# Patient Record
Sex: Male | Born: 1950 | Race: Black or African American | Hispanic: No | Marital: Married | Smoking: Never smoker
Health system: Southern US, Community
[De-identification: ages and names within clinical notes are randomized; demographics above are authoritative.]

## PROBLEM LIST (undated history)

## (undated) DIAGNOSIS — E119 Type 2 diabetes mellitus without complications: Secondary | ICD-10-CM

## (undated) DIAGNOSIS — I2510 Atherosclerotic heart disease of native coronary artery without angina pectoris: Secondary | ICD-10-CM

## (undated) HISTORY — PX: OTHER SURGICAL HISTORY: SHX169

---

## 2020-11-24 ENCOUNTER — Inpatient Hospital Stay (HOSPITAL_COMMUNITY)
Admission: EM | Admit: 2020-11-24 | Discharge: 2020-12-01 | DRG: 155 | Disposition: A | Discharge status: Home / Self Care | Payer: No Typology Code available for payment source | Attending: Surgery | Admitting: Surgery

## 2020-11-24 ENCOUNTER — Emergency Department (HOSPITAL_COMMUNITY): Payer: No Typology Code available for payment source

## 2020-11-24 ENCOUNTER — Other Ambulatory Visit: Payer: Self-pay

## 2020-11-24 ENCOUNTER — Encounter (HOSPITAL_COMMUNITY): Payer: Self-pay | Admitting: *Deleted

## 2020-11-24 DIAGNOSIS — T1490XA Injury, unspecified, initial encounter: Secondary | ICD-10-CM

## 2020-11-24 DIAGNOSIS — E611 Iron deficiency: Secondary | ICD-10-CM | POA: Diagnosis present

## 2020-11-24 DIAGNOSIS — M109 Gout, unspecified: Secondary | ICD-10-CM | POA: Diagnosis present

## 2020-11-24 DIAGNOSIS — Z20822 Contact with and (suspected) exposure to covid-19: Secondary | ICD-10-CM | POA: Diagnosis present

## 2020-11-24 DIAGNOSIS — Z992 Dependence on renal dialysis: Secondary | ICD-10-CM

## 2020-11-24 DIAGNOSIS — S022XXA Fracture of nasal bones, initial encounter for closed fracture: Secondary | ICD-10-CM | POA: Diagnosis not present

## 2020-11-24 DIAGNOSIS — I2510 Atherosclerotic heart disease of native coronary artery without angina pectoris: Secondary | ICD-10-CM | POA: Diagnosis present

## 2020-11-24 DIAGNOSIS — F4310 Post-traumatic stress disorder, unspecified: Secondary | ICD-10-CM | POA: Diagnosis present

## 2020-11-24 DIAGNOSIS — Z955 Presence of coronary angioplasty implant and graft: Secondary | ICD-10-CM

## 2020-11-24 DIAGNOSIS — S3991XA Unspecified injury of abdomen, initial encounter: Secondary | ICD-10-CM | POA: Diagnosis present

## 2020-11-24 DIAGNOSIS — M6282 Rhabdomyolysis: Secondary | ICD-10-CM | POA: Diagnosis present

## 2020-11-24 DIAGNOSIS — N400 Enlarged prostate without lower urinary tract symptoms: Secondary | ICD-10-CM | POA: Diagnosis present

## 2020-11-24 DIAGNOSIS — S270XXA Traumatic pneumothorax, initial encounter: Secondary | ICD-10-CM | POA: Diagnosis present

## 2020-11-24 DIAGNOSIS — E785 Hyperlipidemia, unspecified: Secondary | ICD-10-CM | POA: Diagnosis present

## 2020-11-24 DIAGNOSIS — Z888 Allergy status to other drugs, medicaments and biological substances status: Secondary | ICD-10-CM

## 2020-11-24 DIAGNOSIS — Z794 Long term (current) use of insulin: Secondary | ICD-10-CM

## 2020-11-24 DIAGNOSIS — S2241XA Multiple fractures of ribs, right side, initial encounter for closed fracture: Secondary | ICD-10-CM | POA: Diagnosis present

## 2020-11-24 DIAGNOSIS — I10 Essential (primary) hypertension: Secondary | ICD-10-CM | POA: Diagnosis present

## 2020-11-24 DIAGNOSIS — E1122 Type 2 diabetes mellitus with diabetic chronic kidney disease: Secondary | ICD-10-CM | POA: Diagnosis present

## 2020-11-24 DIAGNOSIS — Z885 Allergy status to narcotic agent status: Secondary | ICD-10-CM

## 2020-11-24 DIAGNOSIS — Y92410 Unspecified street and highway as the place of occurrence of the external cause: Secondary | ICD-10-CM

## 2020-11-24 DIAGNOSIS — N184 Chronic kidney disease, stage 4 (severe): Secondary | ICD-10-CM | POA: Diagnosis present

## 2020-11-24 DIAGNOSIS — D631 Anemia in chronic kidney disease: Secondary | ICD-10-CM | POA: Diagnosis present

## 2020-11-24 DIAGNOSIS — IMO0001 Reserved for inherently not codable concepts without codable children: Secondary | ICD-10-CM | POA: Diagnosis present

## 2020-11-24 DIAGNOSIS — N189 Chronic kidney disease, unspecified: Secondary | ICD-10-CM | POA: Diagnosis present

## 2020-11-24 DIAGNOSIS — N179 Acute kidney failure, unspecified: Secondary | ICD-10-CM | POA: Diagnosis present

## 2020-11-24 DIAGNOSIS — Z8546 Personal history of malignant neoplasm of prostate: Secondary | ICD-10-CM

## 2020-11-24 DIAGNOSIS — K7689 Other specified diseases of liver: Secondary | ICD-10-CM | POA: Diagnosis present

## 2020-11-24 DIAGNOSIS — E875 Hyperkalemia: Secondary | ICD-10-CM | POA: Diagnosis present

## 2020-11-24 DIAGNOSIS — Z7982 Long term (current) use of aspirin: Secondary | ICD-10-CM

## 2020-11-24 DIAGNOSIS — Z79899 Other long term (current) drug therapy: Secondary | ICD-10-CM

## 2020-11-24 DIAGNOSIS — R21 Rash and other nonspecific skin eruption: Secondary | ICD-10-CM | POA: Diagnosis present

## 2020-11-24 DIAGNOSIS — J939 Pneumothorax, unspecified: Secondary | ICD-10-CM

## 2020-11-24 DIAGNOSIS — E1140 Type 2 diabetes mellitus with diabetic neuropathy, unspecified: Secondary | ICD-10-CM | POA: Diagnosis present

## 2020-11-24 DIAGNOSIS — D649 Anemia, unspecified: Secondary | ICD-10-CM | POA: Diagnosis present

## 2020-11-24 DIAGNOSIS — I129 Hypertensive chronic kidney disease with stage 1 through stage 4 chronic kidney disease, or unspecified chronic kidney disease: Secondary | ICD-10-CM | POA: Diagnosis present

## 2020-11-24 HISTORY — DX: Atherosclerotic heart disease of native coronary artery without angina pectoris: I25.10

## 2020-11-24 HISTORY — DX: Type 2 diabetes mellitus without complications: E11.9

## 2020-11-24 LAB — COMPREHENSIVE METABOLIC PANEL
ALT: 29 U/L (ref 0–44)
AST: 48 U/L — ABNORMAL HIGH (ref 15–41)
Albumin: 2.3 g/dL — ABNORMAL LOW (ref 3.5–5.0)
Alkaline Phosphatase: 91 U/L (ref 38–126)
Anion gap: 9 (ref 5–15)
BUN: 24 mg/dL — ABNORMAL HIGH (ref 8–23)
CO2: 20 mmol/L — ABNORMAL LOW (ref 22–32)
Calcium: 8.1 mg/dL — ABNORMAL LOW (ref 8.9–10.3)
Chloride: 111 mmol/L (ref 98–111)
Creatinine, Ser: 4.54 mg/dL — ABNORMAL HIGH (ref 0.61–1.24)
GFR, Estimated: 13 mL/min — ABNORMAL LOW (ref >60)
Glucose, Bld: 165 mg/dL — ABNORMAL HIGH (ref 70–99)
Potassium: 4.1 mmol/L (ref 3.5–5.1)
Sodium: 140 mmol/L (ref 135–145)
Total Bilirubin: 0.8 mg/dL (ref 0.3–1.2)
Total Protein: 5.6 g/dL — ABNORMAL LOW (ref 6.5–8.1)

## 2020-11-24 LAB — I-STAT CHEM 8, ED
BUN: 25 mg/dL — ABNORMAL HIGH (ref 8–23)
Calcium, Ion: 1.1 mmol/L — ABNORMAL LOW (ref 1.15–1.40)
Chloride: 110 mmol/L (ref 98–111)
Creatinine, Ser: 4.8 mg/dL — ABNORMAL HIGH (ref 0.61–1.24)
Glucose, Bld: 160 mg/dL — ABNORMAL HIGH (ref 70–99)
HCT: 28 % — ABNORMAL LOW (ref 39.0–52.0)
Hemoglobin: 9.5 g/dL — ABNORMAL LOW (ref 13.0–17.0)
Potassium: 4 mmol/L (ref 3.5–5.1)
Sodium: 142 mmol/L (ref 135–145)
TCO2: 21 mmol/L — ABNORMAL LOW (ref 22–32)

## 2020-11-24 LAB — CBC WITH DIFFERENTIAL/PLATELET
Abs Immature Granulocytes: 0.07 10*3/uL (ref 0.00–0.07)
Basophils Absolute: 0 10*3/uL (ref 0.0–0.1)
Basophils Relative: 0 %
Eosinophils Absolute: 0.3 10*3/uL (ref 0.0–0.5)
Eosinophils Relative: 5 %
HCT: 30.3 % — ABNORMAL LOW (ref 39.0–52.0)
Hemoglobin: 9.2 g/dL — ABNORMAL LOW (ref 13.0–17.0)
Immature Granulocytes: 1 %
Lymphocytes Relative: 32 %
Lymphs Abs: 1.8 10*3/uL (ref 0.7–4.0)
MCH: 26.4 pg (ref 26.0–34.0)
MCHC: 30.4 g/dL (ref 30.0–36.0)
MCV: 86.8 fL (ref 80.0–100.0)
Monocytes Absolute: 0.3 10*3/uL (ref 0.1–1.0)
Monocytes Relative: 5 %
Neutro Abs: 3.2 10*3/uL (ref 1.7–7.7)
Neutrophils Relative %: 57 %
Platelets: 166 10*3/uL (ref 150–400)
RBC: 3.49 MIL/uL — ABNORMAL LOW (ref 4.22–5.81)
RDW: 14.6 % (ref 11.5–15.5)
WBC: 5.7 10*3/uL (ref 4.0–10.5)
nRBC: 0 % (ref 0.0–0.2)

## 2020-11-24 LAB — PROTIME-INR
INR: 1.2 (ref 0.8–1.2)
Prothrombin Time: 15.1 seconds (ref 11.4–15.2)

## 2020-11-24 LAB — RESP PANEL BY RT-PCR (FLU A&B, COVID) ARPGX2
Influenza A by PCR: NEGATIVE
Influenza B by PCR: NEGATIVE
SARS Coronavirus 2 by RT PCR: NEGATIVE

## 2020-11-24 LAB — APTT: aPTT: 29 seconds (ref 24–36)

## 2020-11-24 MED ORDER — ACETAMINOPHEN 500 MG PO TABS
1000.0000 mg | ORAL_TABLET | Freq: Four times a day (QID) | ORAL | Status: DC
  Administered 2020-11-24 – 2020-11-28 (×14): 1000 mg via ORAL
  Filled 2020-11-24 (×19): qty 2

## 2020-11-24 MED ORDER — LIDOCAINE 5 % EX PTCH
1.0000 | MEDICATED_PATCH | CUTANEOUS | Status: DC
  Administered 2020-11-24 – 2020-11-30 (×7): 1 via TRANSDERMAL
  Filled 2020-11-24 (×7): qty 1

## 2020-11-24 MED ORDER — HEPARIN SODIUM (PORCINE) 5000 UNIT/ML IJ SOLN: 5000.0000 [IU] | Freq: Three times a day (TID) | INTRAMUSCULAR | Status: DC

## 2020-11-24 MED ORDER — IOHEXOL 350 MG/ML SOLN
100.0000 mL | Freq: Once | INTRAVENOUS | Status: AC | PRN
  Administered 2020-11-24: 100 mL via INTRAVENOUS

## 2020-11-24 MED ORDER — METHOCARBAMOL 500 MG PO TABS
1000.0000 mg | ORAL_TABLET | Freq: Three times a day (TID) | ORAL | Status: DC
  Administered 2020-11-24 – 2020-11-28 (×13): 1000 mg via ORAL
  Filled 2020-11-24 (×16): qty 2

## 2020-11-24 MED ORDER — TRAMADOL HCL 50 MG PO TABS
50.0000 mg | ORAL_TABLET | Freq: Four times a day (QID) | ORAL | Status: DC | PRN
  Administered 2020-11-25: 100 mg via ORAL
  Administered 2020-11-26: 50 mg via ORAL
  Administered 2020-11-27: 100 mg via ORAL
  Filled 2020-11-24: qty 1
  Filled 2020-11-24: qty 2
  Filled 2020-11-24: qty 1
  Filled 2020-11-24: qty 2
  Filled 2020-11-24: qty 1
  Filled 2020-11-24: qty 2

## 2020-11-24 MED ORDER — METOPROLOL TARTRATE 5 MG/5ML IV SOLN: 5.0000 mg | Freq: Four times a day (QID) | INTRAVENOUS | Status: DC | PRN

## 2020-11-24 MED ORDER — ONDANSETRON 4 MG PO TBDP
4.0000 mg | ORAL_TABLET | Freq: Four times a day (QID) | ORAL | Status: DC | PRN
  Administered 2020-11-28: 4 mg via ORAL
  Filled 2020-11-24 (×2): qty 1

## 2020-11-24 MED ORDER — ONDANSETRON HCL 4 MG/2ML IJ SOLN
4.0000 mg | Freq: Four times a day (QID) | INTRAMUSCULAR | Status: DC | PRN
  Administered 2020-11-24 – 2020-11-29 (×6): 4 mg via INTRAVENOUS
  Filled 2020-11-24 (×7): qty 2

## 2020-11-24 MED ORDER — HYDROMORPHONE HCL 1 MG/ML IJ SOLN
0.5000 mg | INTRAMUSCULAR | Status: DC | PRN
  Administered 2020-11-24 – 2020-11-25 (×2): 0.5 mg via INTRAVENOUS
  Filled 2020-11-24: qty 0.5
  Filled 2020-11-24: qty 1

## 2020-11-24 MED ORDER — BACITRACIN ZINC 500 UNIT/GM EX OINT
TOPICAL_OINTMENT | Freq: Two times a day (BID) | CUTANEOUS | Status: DC
  Filled 2020-11-24: qty 28.4
  Filled 2020-11-24: qty 28.35

## 2020-11-24 MED ORDER — DOCUSATE SODIUM 100 MG PO CAPS
100.0000 mg | ORAL_CAPSULE | Freq: Two times a day (BID) | ORAL | Status: DC
  Administered 2020-11-24 – 2020-11-25 (×2): 100 mg via ORAL
  Filled 2020-11-24 (×2): qty 1

## 2020-11-24 MED ORDER — SODIUM CHLORIDE 0.9 % IV SOLN: INTRAVENOUS | Status: DC

## 2020-11-24 NOTE — TOC CAGE-AID Note (Signed)
Transition of Care [facility]) - CAGE-AID Screening   Patient Details  Name: Maurice Lopez MRN: [id number] Date of Birth: 02-23-1951  Clinical Narrative:  Patient endorses some alcohol use, denies any need for substance abuse resources at this time.  CAGE-AID Screening:    Have You Ever Felt You Ought to Cut Down on Your Drinking or Drug Use?: No Have People Annoyed You By Critizing Your Drinking Or Drug Use?: No Have You Felt Bad Or Guilty About Your Drinking Or Drug Use?: No Have You Ever Had a Drink or Used Drugs First Thing In The Morning to Steady Your Nerves or to Get Rid of a Hangover?: No CAGE-AID Score: 0  Substance Abuse Education Offered: Yes
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
## 2020-11-24 NOTE — ED Notes (Signed)
Officer [place] for follow up information: [phone]

## 2020-11-24 NOTE — Progress Notes (Signed)
RT note. Patient refuse CPAP for the night.  RT will continue to monitor, and will place cpap on patient if needed.
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
## 2020-11-24 NOTE — Progress Notes (Signed)
Tetanus status up to date per patient interview - not given in ED for this reason.

## 2020-11-24 NOTE — ED Notes (Signed)
To CT

## 2020-11-25 ENCOUNTER — Observation Stay (HOSPITAL_COMMUNITY): Payer: No Typology Code available for payment source

## 2020-11-25 ENCOUNTER — Encounter (HOSPITAL_COMMUNITY): Payer: Self-pay

## 2020-11-25 DIAGNOSIS — N400 Enlarged prostate without lower urinary tract symptoms: Secondary | ICD-10-CM | POA: Diagnosis present

## 2020-11-25 DIAGNOSIS — H25812 Combined forms of age-related cataract, left eye: Secondary | ICD-10-CM | POA: Insufficient documentation

## 2020-11-25 DIAGNOSIS — Z794 Long term (current) use of insulin: Secondary | ICD-10-CM | POA: Diagnosis not present

## 2020-11-25 DIAGNOSIS — E1140 Type 2 diabetes mellitus with diabetic neuropathy, unspecified: Secondary | ICD-10-CM | POA: Diagnosis present

## 2020-11-25 DIAGNOSIS — F4310 Post-traumatic stress disorder, unspecified: Secondary | ICD-10-CM | POA: Diagnosis present

## 2020-11-25 DIAGNOSIS — I129 Hypertensive chronic kidney disease with stage 1 through stage 4 chronic kidney disease, or unspecified chronic kidney disease: Secondary | ICD-10-CM | POA: Diagnosis present

## 2020-11-25 DIAGNOSIS — S270XXA Traumatic pneumothorax, initial encounter: Secondary | ICD-10-CM | POA: Diagnosis present

## 2020-11-25 DIAGNOSIS — I10 Essential (primary) hypertension: Secondary | ICD-10-CM | POA: Diagnosis present

## 2020-11-25 DIAGNOSIS — Z79899 Other long term (current) drug therapy: Secondary | ICD-10-CM | POA: Diagnosis not present

## 2020-11-25 DIAGNOSIS — Z20822 Contact with and (suspected) exposure to covid-19: Secondary | ICD-10-CM | POA: Diagnosis present

## 2020-11-25 DIAGNOSIS — Z961 Presence of intraocular lens: Secondary | ICD-10-CM | POA: Insufficient documentation

## 2020-11-25 DIAGNOSIS — Z8546 Personal history of malignant neoplasm of prostate: Secondary | ICD-10-CM | POA: Diagnosis not present

## 2020-11-25 DIAGNOSIS — E1122 Type 2 diabetes mellitus with diabetic chronic kidney disease: Secondary | ICD-10-CM | POA: Diagnosis present

## 2020-11-25 DIAGNOSIS — Z885 Allergy status to narcotic agent status: Secondary | ICD-10-CM | POA: Diagnosis not present

## 2020-11-25 DIAGNOSIS — D631 Anemia in chronic kidney disease: Secondary | ICD-10-CM | POA: Diagnosis present

## 2020-11-25 DIAGNOSIS — E785 Hyperlipidemia, unspecified: Secondary | ICD-10-CM | POA: Insufficient documentation

## 2020-11-25 DIAGNOSIS — Y92410 Unspecified street and highway as the place of occurrence of the external cause: Secondary | ICD-10-CM | POA: Diagnosis not present

## 2020-11-25 DIAGNOSIS — M109 Gout, unspecified: Secondary | ICD-10-CM | POA: Insufficient documentation

## 2020-11-25 DIAGNOSIS — Z955 Presence of coronary angioplasty implant and graft: Secondary | ICD-10-CM | POA: Diagnosis not present

## 2020-11-25 DIAGNOSIS — M6282 Rhabdomyolysis: Secondary | ICD-10-CM | POA: Diagnosis present

## 2020-11-25 DIAGNOSIS — S2241XA Multiple fractures of ribs, right side, initial encounter for closed fracture: Secondary | ICD-10-CM | POA: Diagnosis present

## 2020-11-25 DIAGNOSIS — C61 Malignant neoplasm of prostate: Secondary | ICD-10-CM | POA: Insufficient documentation

## 2020-11-25 DIAGNOSIS — S022XXA Fracture of nasal bones, initial encounter for closed fracture: Secondary | ICD-10-CM | POA: Diagnosis present

## 2020-11-25 DIAGNOSIS — Z992 Dependence on renal dialysis: Secondary | ICD-10-CM | POA: Diagnosis not present

## 2020-11-25 DIAGNOSIS — T1490XA Injury, unspecified, initial encounter: Secondary | ICD-10-CM | POA: Diagnosis present

## 2020-11-25 DIAGNOSIS — I2510 Atherosclerotic heart disease of native coronary artery without angina pectoris: Secondary | ICD-10-CM | POA: Diagnosis present

## 2020-11-25 DIAGNOSIS — IMO0001 Reserved for inherently not codable concepts without codable children: Secondary | ICD-10-CM | POA: Diagnosis present

## 2020-11-25 DIAGNOSIS — K429 Umbilical hernia without obstruction or gangrene: Secondary | ICD-10-CM | POA: Insufficient documentation

## 2020-11-25 DIAGNOSIS — K7689 Other specified diseases of liver: Secondary | ICD-10-CM | POA: Diagnosis present

## 2020-11-25 DIAGNOSIS — N184 Chronic kidney disease, stage 4 (severe): Secondary | ICD-10-CM | POA: Diagnosis present

## 2020-11-25 DIAGNOSIS — N189 Chronic kidney disease, unspecified: Secondary | ICD-10-CM | POA: Diagnosis present

## 2020-11-25 DIAGNOSIS — N179 Acute kidney failure, unspecified: Secondary | ICD-10-CM | POA: Diagnosis present

## 2020-11-25 DIAGNOSIS — Z888 Allergy status to other drugs, medicaments and biological substances status: Secondary | ICD-10-CM | POA: Diagnosis not present

## 2020-11-25 DIAGNOSIS — D649 Anemia, unspecified: Secondary | ICD-10-CM | POA: Diagnosis present

## 2020-11-25 LAB — GLUCOSE, CAPILLARY
Glucose-Capillary: 124 mg/dL — ABNORMAL HIGH (ref 70–99)
Glucose-Capillary: 147 mg/dL — ABNORMAL HIGH (ref 70–99)
Glucose-Capillary: 187 mg/dL — ABNORMAL HIGH (ref 70–99)
Glucose-Capillary: 196 mg/dL — ABNORMAL HIGH (ref 70–99)
Glucose-Capillary: 181 mg/dL — ABNORMAL HIGH (ref 70–99)

## 2020-11-25 LAB — BASIC METABOLIC PANEL
Anion gap: 5 (ref 5–15)
BUN: 29 mg/dL — ABNORMAL HIGH (ref 8–23)
CO2: 22 mmol/L (ref 22–32)
Calcium: 8.2 mg/dL — ABNORMAL LOW (ref 8.9–10.3)
Chloride: 112 mmol/L — ABNORMAL HIGH (ref 98–111)
Creatinine, Ser: 5.03 mg/dL — ABNORMAL HIGH (ref 0.61–1.24)
GFR, Estimated: 12 mL/min — ABNORMAL LOW (ref >60)
Glucose, Bld: 173 mg/dL — ABNORMAL HIGH (ref 70–99)
Potassium: 5.1 mmol/L (ref 3.5–5.1)
Sodium: 139 mmol/L (ref 135–145)

## 2020-11-25 LAB — CBC
HCT: 27.4 % — ABNORMAL LOW (ref 39.0–52.0)
Hemoglobin: 8.6 g/dL — ABNORMAL LOW (ref 13.0–17.0)
MCH: 26.4 pg (ref 26.0–34.0)
MCHC: 31.4 g/dL (ref 30.0–36.0)
MCV: 84 fL (ref 80.0–100.0)
Platelets: 168 10*3/uL (ref 150–400)
RBC: 3.26 MIL/uL — ABNORMAL LOW (ref 4.22–5.81)
RDW: 14.6 % (ref 11.5–15.5)
WBC: 8.2 10*3/uL (ref 4.0–10.5)
nRBC: 0 % (ref 0.0–0.2)

## 2020-11-25 LAB — HIV ANTIBODY (ROUTINE TESTING W REFLEX): HIV Screen 4th Generation wRfx: NONREACTIVE

## 2020-11-25 LAB — HEMOGLOBIN A1C
Hgb A1c MFr Bld: 6.6 % — ABNORMAL HIGH (ref 4.8–5.6)
Mean Plasma Glucose: 142.72 mg/dL

## 2020-11-25 MED ORDER — INSULIN ASPART 100 UNIT/ML IJ SOLN
0.0000 [IU] | Freq: Every day | INTRAMUSCULAR | Status: DC
Start: 2020-11-25 — End: 2020-12-01

## 2020-11-25 MED ORDER — INSULIN ASPART 100 UNIT/ML IJ SOLN
0.0000 [IU] | Freq: Three times a day (TID) | INTRAMUSCULAR | Status: DC
Start: 2020-11-25 — End: 2020-12-01
  Administered 2020-11-25: 1 [IU] via SUBCUTANEOUS
  Administered 2020-11-25 – 2020-11-26 (×2): 2 [IU] via SUBCUTANEOUS
  Administered 2020-11-27: 1 [IU] via SUBCUTANEOUS
  Administered 2020-11-27: 2 [IU] via SUBCUTANEOUS
  Administered 2020-11-28 – 2020-12-01 (×4): 1 [IU] via SUBCUTANEOUS

## 2020-11-25 MED ORDER — VITAMIN D 25 MCG (1000 UNIT) PO TABS
2000.0000 [IU] | ORAL_TABLET | Freq: Every day | ORAL | Status: DC
  Administered 2020-11-25 – 2020-12-01 (×7): 2000 [IU] via ORAL
  Filled 2020-11-25 (×7): qty 2

## 2020-11-25 MED ORDER — DOXAZOSIN MESYLATE 4 MG PO TABS
4.0000 mg | ORAL_TABLET | Freq: Every day | ORAL | Status: DC
  Administered 2020-11-25 – 2020-11-27 (×3): 4 mg via ORAL
  Filled 2020-11-25 (×4): qty 1

## 2020-11-25 MED ORDER — KETOROLAC TROMETHAMINE 0.5 % OP SOLN
1.0000 [drp] | Freq: Two times a day (BID) | OPHTHALMIC | Status: DC
  Administered 2020-11-25 – 2020-12-01 (×13): 1 [drp] via OPHTHALMIC
  Filled 2020-11-25: qty 5

## 2020-11-25 MED ORDER — ALLOPURINOL 100 MG PO TABS
100.0000 mg | ORAL_TABLET | Freq: Every day | ORAL | Status: DC
  Administered 2020-11-25 – 2020-11-26 (×2): 100 mg via ORAL
  Filled 2020-11-25 (×2): qty 1

## 2020-11-25 MED ORDER — DEXAMETHASONE 0.5 MG PO TABS: 0.5000 mg | ORAL_TABLET | Freq: Two times a day (BID) | ORAL | Status: DC

## 2020-11-25 MED ORDER — INSULIN ASPART PROT & ASPART (70-30 MIX) 100 UNIT/ML PEN
25.0000 [IU] | PEN_INJECTOR | Freq: Every day | SUBCUTANEOUS | Status: DC
  Administered 2020-11-26 – 2020-12-01 (×6): 25 [IU] via SUBCUTANEOUS
  Filled 2020-11-25: qty 3

## 2020-11-25 MED ORDER — BISACODYL 5 MG PO TBEC: 5.0000 mg | DELAYED_RELEASE_TABLET | Freq: Every day | ORAL | Status: DC | PRN

## 2020-11-25 MED ORDER — AMLODIPINE BESYLATE 10 MG PO TABS
10.0000 mg | ORAL_TABLET | Freq: Every day | ORAL | Status: DC
  Administered 2020-11-25 – 2020-12-01 (×7): 10 mg via ORAL
  Filled 2020-11-25 (×7): qty 1

## 2020-11-25 MED ORDER — BISACODYL 5 MG PO TBEC
5.0000 mg | DELAYED_RELEASE_TABLET | Freq: Every day | ORAL | Status: DC
  Administered 2020-11-25 – 2020-12-01 (×7): 5 mg via ORAL
  Filled 2020-11-25 (×7): qty 1

## 2020-11-25 MED ORDER — SERTRALINE HCL 100 MG PO TABS
200.0000 mg | ORAL_TABLET | Freq: Every day | ORAL | Status: DC
  Administered 2020-11-25 – 2020-11-28 (×4): 200 mg via ORAL
  Filled 2020-11-25 (×4): qty 2

## 2020-11-25 MED ORDER — ATORVASTATIN CALCIUM 80 MG PO TABS
80.0000 mg | ORAL_TABLET | Freq: Every day | ORAL | Status: DC
  Administered 2020-11-25: 80 mg via ORAL
  Filled 2020-11-25: qty 1

## 2020-11-25 MED ORDER — FERROUS SULFATE 325 (65 FE) MG PO TABS
325.0000 mg | ORAL_TABLET | ORAL | Status: DC
  Administered 2020-11-26: 325 mg via ORAL
  Filled 2020-11-25: qty 1

## 2020-11-25 MED ORDER — INSULIN ASPART 100 UNIT/ML IJ SOLN: 0.0000 [IU] | Freq: Three times a day (TID) | INTRAMUSCULAR | Status: DC

## 2020-11-25 NOTE — Progress Notes (Signed)
Patient refused the use of CPAP for the evening.  
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
## 2020-11-25 NOTE — ED Notes (Signed)
Attempted to give report x2 
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
## 2020-11-25 NOTE — Progress Notes (Signed)
Have attempted to call wife x2 - phone rings and then goes through to fax line. Will try again tomorrow.
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
## 2020-11-25 NOTE — ED Notes (Signed)
Attempted to give reportx1 

## 2020-11-26 ENCOUNTER — Inpatient Hospital Stay (HOSPITAL_COMMUNITY): Payer: No Typology Code available for payment source

## 2020-11-26 LAB — BASIC METABOLIC PANEL
Anion gap: 7 (ref 5–15)
BUN: 37 mg/dL — ABNORMAL HIGH (ref 8–23)
CO2: 21 mmol/L — ABNORMAL LOW (ref 22–32)
Calcium: 8.4 mg/dL — ABNORMAL LOW (ref 8.9–10.3)
Chloride: 110 mmol/L (ref 98–111)
Creatinine, Ser: 6.76 mg/dL — ABNORMAL HIGH (ref 0.61–1.24)
GFR, Estimated: 8 mL/min — ABNORMAL LOW (ref >60)
Glucose, Bld: 122 mg/dL — ABNORMAL HIGH (ref 70–99)
Potassium: 5.8 mmol/L — ABNORMAL HIGH (ref 3.5–5.1)
Sodium: 138 mmol/L (ref 135–145)

## 2020-11-26 LAB — FERRITIN: Ferritin: 120 ng/mL (ref 24–336)

## 2020-11-26 LAB — GLUCOSE, CAPILLARY
Glucose-Capillary: 108 mg/dL — ABNORMAL HIGH (ref 70–99)
Glucose-Capillary: 150 mg/dL — ABNORMAL HIGH (ref 70–99)
Glucose-Capillary: 160 mg/dL — ABNORMAL HIGH (ref 70–99)
Glucose-Capillary: 124 mg/dL — ABNORMAL HIGH (ref 70–99)

## 2020-11-26 LAB — IRON AND TIBC
Iron: 22 ug/dL — ABNORMAL LOW (ref 45–182)
Saturation Ratios: 10 % — ABNORMAL LOW (ref 17.9–39.5)
TIBC: 210 ug/dL — ABNORMAL LOW (ref 250–450)
UIBC: 188 ug/dL

## 2020-11-26 LAB — CBC
HCT: 26.7 % — ABNORMAL LOW (ref 39.0–52.0)
Hemoglobin: 8.1 g/dL — ABNORMAL LOW (ref 13.0–17.0)
MCH: 25.9 pg — ABNORMAL LOW (ref 26.0–34.0)
MCHC: 30.3 g/dL (ref 30.0–36.0)
MCV: 85.3 fL (ref 80.0–100.0)
Platelets: 152 10*3/uL (ref 150–400)
RBC: 3.13 MIL/uL — ABNORMAL LOW (ref 4.22–5.81)
RDW: 15.1 % (ref 11.5–15.5)
WBC: 8 10*3/uL (ref 4.0–10.5)
nRBC: 0 % (ref 0.0–0.2)

## 2020-11-26 LAB — CK: Total CK: 1056 U/L — ABNORMAL HIGH (ref 49–397)

## 2020-11-26 MED ORDER — PREGABALIN 25 MG PO CAPS
50.0000 mg | ORAL_CAPSULE | Freq: Every day | ORAL | Status: DC
  Administered 2020-11-26 – 2020-12-01 (×6): 50 mg via ORAL
  Filled 2020-11-26 (×6): qty 2

## 2020-11-26 MED ORDER — ASPIRIN 325 MG PO TABS
325.0000 mg | ORAL_TABLET | Freq: Every day | ORAL | Status: DC
  Administered 2020-11-26: 325 mg via ORAL
  Filled 2020-11-26: qty 1

## 2020-11-26 MED ORDER — SODIUM CHLORIDE 0.9 % IV SOLN
250.0000 mg | Freq: Every day | INTRAVENOUS | Status: AC
  Administered 2020-11-26 – 2020-11-29 (×4): 250 mg via INTRAVENOUS
  Filled 2020-11-26 (×4): qty 20

## 2020-11-26 MED ORDER — HEPARIN SODIUM (PORCINE) 5000 UNIT/ML IJ SOLN
5000.0000 [IU] | Freq: Two times a day (BID) | INTRAMUSCULAR | Status: DC
  Administered 2020-11-26 – 2020-11-28 (×5): 5000 [IU] via SUBCUTANEOUS
  Filled 2020-11-26 (×5): qty 1

## 2020-11-26 MED ORDER — SODIUM ZIRCONIUM CYCLOSILICATE 5 G PO PACK
5.0000 g | PACK | Freq: Once | ORAL | Status: AC
  Administered 2020-11-26: 5 g via ORAL
  Filled 2020-11-26: qty 1

## 2020-11-26 MED ORDER — SODIUM CHLORIDE 0.9 % IV SOLN
12.5000 mg | Freq: Four times a day (QID) | INTRAVENOUS | Status: DC | PRN
  Administered 2020-11-26: 12.5 mg via INTRAVENOUS
  Filled 2020-11-26: qty 0.5

## 2020-11-26 MED ORDER — BOOST PLUS PO LIQD
237.0000 mL | Freq: Three times a day (TID) | ORAL | Status: DC
  Administered 2020-11-26 – 2020-11-30 (×6): 237 mL via ORAL
  Filled 2020-11-26 (×17): qty 237

## 2020-11-26 MED ORDER — ZOLPIDEM TARTRATE 5 MG PO TABS: 5.0000 mg | ORAL_TABLET | Freq: Every evening | ORAL | Status: DC | PRN

## 2020-11-26 MED ORDER — FUROSEMIDE 10 MG/ML IJ SOLN
80.0000 mg | Freq: Two times a day (BID) | INTRAMUSCULAR | Status: AC
  Administered 2020-11-26 – 2020-11-27 (×2): 80 mg via INTRAVENOUS
  Filled 2020-11-26 (×2): qty 8

## 2020-11-26 MED ORDER — PROMETHAZINE HCL 25 MG PO TABS
12.5000 mg | ORAL_TABLET | Freq: Four times a day (QID) | ORAL | Status: DC | PRN
  Administered 2020-11-28 – 2020-11-29 (×2): 12.5 mg via ORAL
  Filled 2020-11-26 (×3): qty 1
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
## 2020-11-26 NOTE — Progress Notes (Signed)
SATURATION QUALIFICATIONS: (This note is used to comply with regulatory documentation for home oxygen)  Patient Saturations on Room Air at Rest = 91%  Patient Saturations on Room Air while Ambulating = 83%  Patient Saturations on 2 Liters of oxygen while Ambulating = 94%  Please briefly explain why patient needs home oxygen: Pt desaturates on RA during mobility.    Verdene Lennert, PT, DPT  Acute Rehabilitation Ortho Tech Supervisor [phone] pager [phone] office

## 2020-11-26 NOTE — Progress Notes (Signed)
OT Cancellation Note  Patient Details Name: Hiren Peplinski MRN: [id number] DOB: 1950-08-01   Cancelled Treatment:    Reason Eval/Treat Not Completed: Other (comment) (2nd attempt today. Pt declining upon arrival both times despite max encouragement. Pt stating "I just dont feel like it" the first time, and the second, reporting he is resting from his PT session. OT tx will f/u as time allows.)  Aldona Bar A Damyra Luscher 11/26/2020, 5:02 PM
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
## 2020-11-26 NOTE — Progress Notes (Signed)
Holding statin per pharmacy reccs in light of CK >1000. Holding ASA per nephrology Appreciate their assistance

## 2020-11-26 NOTE — Plan of Care (Signed)
  Problem: Clinical Measurements: Goal: Cardiovascular complication will be avoided Outcome: Progressing   Problem: Clinical Measurements: Goal: Respiratory complications will improve Outcome: Progressing   Problem: Activity: Goal: Risk for activity intolerance will decrease Outcome: Progressing   Problem: Nutrition: Goal: Adequate nutrition will be maintained Outcome: Progressing   Problem: Coping: Goal: Level of anxiety will decrease Outcome: Progressing   Problem: Elimination: Goal: Will not experience complications related to urinary retention Outcome: Progressing   Problem: Pain Managment: Goal: General experience of comfort will improve Outcome: Progressing   Problem: Safety: Goal: Ability to remain free from injury will improve Outcome: Progressing   Problem: Skin Integrity: Goal: Risk for impaired skin integrity will decrease Outcome: Progressing
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
## 2020-11-27 LAB — URINALYSIS, MICROSCOPIC (REFLEX)

## 2020-11-27 LAB — URINALYSIS, ROUTINE W REFLEX MICROSCOPIC
Bilirubin Urine: NEGATIVE
Glucose, UA: 100 mg/dL — AB
Ketones, ur: NEGATIVE mg/dL
Leukocytes,Ua: NEGATIVE
Nitrite: NEGATIVE
Protein, ur: >300 mg/dL — AB
Specific Gravity, Urine: 1.025 (ref 1.005–1.030)
pH: 5 (ref 5.0–8.0)

## 2020-11-27 LAB — BASIC METABOLIC PANEL
Anion gap: 7 (ref 5–15)
BUN: 44 mg/dL — ABNORMAL HIGH (ref 8–23)
CO2: 20 mmol/L — ABNORMAL LOW (ref 22–32)
Calcium: 8.4 mg/dL — ABNORMAL LOW (ref 8.9–10.3)
Chloride: 111 mmol/L (ref 98–111)
Creatinine, Ser: 7.93 mg/dL — ABNORMAL HIGH (ref 0.61–1.24)
GFR, Estimated: 7 mL/min — ABNORMAL LOW (ref >60)
Glucose, Bld: 113 mg/dL — ABNORMAL HIGH (ref 70–99)
Potassium: 5.9 mmol/L — ABNORMAL HIGH (ref 3.5–5.1)
Sodium: 138 mmol/L (ref 135–145)

## 2020-11-27 LAB — CBC
HCT: 25.2 % — ABNORMAL LOW (ref 39.0–52.0)
Hemoglobin: 7.7 g/dL — ABNORMAL LOW (ref 13.0–17.0)
MCH: 26.1 pg (ref 26.0–34.0)
MCHC: 30.6 g/dL (ref 30.0–36.0)
MCV: 85.4 fL (ref 80.0–100.0)
Platelets: 146 10*3/uL — ABNORMAL LOW (ref 150–400)
RBC: 2.95 MIL/uL — ABNORMAL LOW (ref 4.22–5.81)
RDW: 15.1 % (ref 11.5–15.5)
WBC: 9.2 10*3/uL (ref 4.0–10.5)
nRBC: 0 % (ref 0.0–0.2)

## 2020-11-27 LAB — GLUCOSE, CAPILLARY
Glucose-Capillary: 106 mg/dL — ABNORMAL HIGH (ref 70–99)
Glucose-Capillary: 110 mg/dL — ABNORMAL HIGH (ref 70–99)
Glucose-Capillary: 129 mg/dL — ABNORMAL HIGH (ref 70–99)
Glucose-Capillary: 138 mg/dL — ABNORMAL HIGH (ref 70–99)
Glucose-Capillary: 146 mg/dL — ABNORMAL HIGH (ref 70–99)
Glucose-Capillary: 148 mg/dL — ABNORMAL HIGH (ref 70–99)
Glucose-Capillary: 153 mg/dL — ABNORMAL HIGH (ref 70–99)
Glucose-Capillary: 93 mg/dL (ref 70–99)

## 2020-11-27 LAB — POTASSIUM: Potassium: 5.4 mmol/L — ABNORMAL HIGH (ref 3.5–5.1)

## 2020-11-27 LAB — SODIUM, URINE, RANDOM: Sodium, Ur: 20 mmol/L

## 2020-11-27 LAB — CREATININE, URINE, RANDOM: Creatinine, Urine: 159.44 mg/dL

## 2020-11-27 MED ORDER — FUROSEMIDE 10 MG/ML IJ SOLN
80.0000 mg | Freq: Two times a day (BID) | INTRAMUSCULAR | Status: AC
Start: 2020-11-27 — End: 2020-11-27
  Administered 2020-11-27 (×2): 80 mg via INTRAVENOUS
  Filled 2020-11-27 (×2): qty 8

## 2020-11-27 MED ORDER — SODIUM ZIRCONIUM CYCLOSILICATE 10 G PO PACK
10.0000 g | PACK | Freq: Two times a day (BID) | ORAL | Status: DC
  Administered 2020-11-27 – 2020-11-29 (×6): 10 g via ORAL
  Filled 2020-11-27 (×5): qty 1

## 2020-11-27 MED ORDER — DARBEPOETIN ALFA 150 MCG/0.3ML IJ SOSY
150.0000 ug | PREFILLED_SYRINGE | INTRAMUSCULAR | Status: DC
  Administered 2020-11-27: 150 ug via SUBCUTANEOUS
  Filled 2020-11-27: qty 0.3

## 2020-11-27 MED ORDER — ALBUTEROL SULFATE (2.5 MG/3ML) 0.083% IN NEBU
10.0000 mg | INHALATION_SOLUTION | Freq: Once | RESPIRATORY_TRACT | Status: AC
  Administered 2020-11-27: 10 mg via RESPIRATORY_TRACT
  Filled 2020-11-27: qty 12

## 2020-11-27 MED ORDER — INSULIN ASPART 100 UNIT/ML IV SOLN
5.0000 [IU] | Freq: Once | INTRAVENOUS | Status: AC
  Administered 2020-11-27: 5 [IU] via INTRAVENOUS

## 2020-11-27 MED ORDER — DEXTROSE 50 % IV SOLN
1.0000 | Freq: Once | INTRAVENOUS | Status: AC
  Administered 2020-11-27: 50 mL via INTRAVENOUS
  Filled 2020-11-27: qty 50
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
## 2020-11-27 NOTE — Progress Notes (Signed)
Pt refused cpap for the night.  
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
## 2020-11-27 NOTE — Progress Notes (Signed)
This chaplain is present for F/U spiritual care.  The chaplain is joined by the Pt. RN-Shon at the bedside.   The chaplain understands the Pt. is concerned about his rib pain today. The RN explained his options for pain control.  The chaplain understands the Pt. is regularly communicating with his family. The Pt. anticipates a visit from his family on Friday.    This chaplain F/U spiritual care as needed.

## 2020-11-27 NOTE — Progress Notes (Signed)
SATURATION QUALIFICATIONS: (This note is used to comply with regulatory documentation for home oxygen)  Patient Saturations on Room Air at Rest = 94%  Patient Saturations on Room Air while Ambulating = 81%  Patient Saturations on 2 Liters of oxygen while Ambulating = 94%  Please briefly explain why patient needs home oxygen: Despite less DOE during ambulation today, pt continues to de sat on RA with mobility.    Thanks,  Verdene Lennert, PT, DPT  Acute Rehabilitation Ortho Tech Supervisor [phone] pager [phone] office
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
## 2020-11-27 NOTE — Progress Notes (Signed)
Send urine sample to lab.

## 2020-11-28 ENCOUNTER — Inpatient Hospital Stay (HOSPITAL_COMMUNITY): Payer: No Typology Code available for payment source

## 2020-11-28 LAB — BASIC METABOLIC PANEL
Anion gap: 9 (ref 5–15)
BUN: 50 mg/dL — ABNORMAL HIGH (ref 8–23)
CO2: 20 mmol/L — ABNORMAL LOW (ref 22–32)
Calcium: 8.3 mg/dL — ABNORMAL LOW (ref 8.9–10.3)
Chloride: 108 mmol/L (ref 98–111)
Creatinine, Ser: 9.7 mg/dL — ABNORMAL HIGH (ref 0.61–1.24)
GFR, Estimated: 5 mL/min — ABNORMAL LOW (ref >60)
Glucose, Bld: 111 mg/dL — ABNORMAL HIGH (ref 70–99)
Potassium: 5.6 mmol/L — ABNORMAL HIGH (ref 3.5–5.1)
Sodium: 137 mmol/L (ref 135–145)

## 2020-11-28 LAB — GLUCOSE, CAPILLARY
Glucose-Capillary: 109 mg/dL — ABNORMAL HIGH (ref 70–99)
Glucose-Capillary: 139 mg/dL — ABNORMAL HIGH (ref 70–99)
Glucose-Capillary: 108 mg/dL — ABNORMAL HIGH (ref 70–99)
Glucose-Capillary: 97 mg/dL (ref 70–99)

## 2020-11-28 LAB — CBC
HCT: 25 % — ABNORMAL LOW (ref 39.0–52.0)
Hemoglobin: 7.4 g/dL — ABNORMAL LOW (ref 13.0–17.0)
MCH: 25.6 pg — ABNORMAL LOW (ref 26.0–34.0)
MCHC: 29.6 g/dL — ABNORMAL LOW (ref 30.0–36.0)
MCV: 86.5 fL (ref 80.0–100.0)
Platelets: 151 10*3/uL (ref 150–400)
RBC: 2.89 MIL/uL — ABNORMAL LOW (ref 4.22–5.81)
RDW: 15 % (ref 11.5–15.5)
WBC: 8.4 10*3/uL (ref 4.0–10.5)
nRBC: 0.2 % (ref 0.0–0.2)

## 2020-11-28 MED ORDER — HEPARIN SODIUM (PORCINE) 5000 UNIT/ML IJ SOLN: 5000.0000 [IU] | Freq: Three times a day (TID) | INTRAMUSCULAR | Status: DC

## 2020-11-28 MED ORDER — HEPARIN SODIUM (PORCINE) 5000 UNIT/ML IJ SOLN
5000.0000 [IU] | Freq: Three times a day (TID) | INTRAMUSCULAR | Status: AC
  Administered 2020-11-28: 5000 [IU] via SUBCUTANEOUS
  Filled 2020-11-28: qty 1

## 2020-11-28 MED ORDER — FUROSEMIDE 10 MG/ML IJ SOLN
160.0000 mg | Freq: Two times a day (BID) | INTRAVENOUS | Status: DC
  Administered 2020-11-28 – 2020-12-01 (×6): 160 mg via INTRAVENOUS
  Filled 2020-11-28: qty 16
  Filled 2020-11-28: qty 10
  Filled 2020-11-28 (×2): qty 16
  Filled 2020-11-28: qty 10
  Filled 2020-11-28 (×3): qty 16
  Filled 2020-11-28: qty 10

## 2020-11-28 MED ORDER — FUROSEMIDE 10 MG/ML IJ SOLN
160.0000 mg | Freq: Two times a day (BID) | INTRAVENOUS | Status: DC
  Filled 2020-11-28 (×2): qty 16

## 2020-11-28 MED ORDER — TAMSULOSIN HCL 0.4 MG PO CAPS
0.4000 mg | ORAL_CAPSULE | Freq: Every day | ORAL | Status: DC
  Administered 2020-11-28 – 2020-11-30 (×2): 0.4 mg via ORAL
  Filled 2020-11-28 (×2): qty 1

## 2020-11-28 NOTE — Progress Notes (Signed)
Pt's wife and daughter were bedside in the evening. Pt would like the HD catheter placement. He stated he would like to start dialysis while he's here, if possible. He will likely be doing future dialysis with a physician the family knows near [place].
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
## 2020-11-28 NOTE — Progress Notes (Addendum)
Subjective:   Feels okay this morning. Still requiring supplemental O2. Has been urinating in the commode so no UOP in last 24 hrs. Feels his belly and legs are more swollen. He would like to wear his compression socks to help with leg swelling. Also, reports he was on Flomax at home.   Objective Vital signs in last 24 hours: Vitals:   11/27/20 2126 11/28/20 0400 11/28/20 0757 11/28/20 1129  BP: (!) 164/80 (!) 168/85 (!) 177/84 (!) 183/87  Pulse: 90 94 96 97  Resp: 15 17 14 16   Temp: 97.7 F (36.5 C) 98.4 F (36.9 C) (!) 97.3 F (36.3 C) 98.2 F (36.8 C)  TempSrc: Oral Oral Oral Oral  SpO2: 99% 100% 100% 100%  Weight:      Height:       Weight change:   Intake/Output Summary (Last 24 hours) at 11/28/2020 2297 Last data filed at 11/28/2020 0400 Gross per 24 hour  Intake 610 ml  Output --  Net 610 ml    Assessment/ Plan: Pt is a 70 y.o. 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  Assessment/Plan: AKI on CKD IV: Baseline sCr 4.3 (as of 08/19/20) noted to be 4.5 on admission. Has had worsening kidney function since admission. Likely 2/2 to mild rhabdomyolysis (CK 1,056) in the setting of baseline CKD. Renal U/S and UA unremarkable. BUN/sCr of 50/9.70 today. Making urine but unable to assess as pt has been using the commode each time. Patient fluid overloaded on exam, requiring supplemental O2 at [facility] to maintain sats above 93%. Discussed the need to start HD to improved kidney function, but pt not ready to start at the moment.  -- Increase IV lasix to 160 mg q12h -- Strict I/Os, daily weight -- Avoid nephrotoxic agents --Will need HD if kidney function does not improve over the weekend.  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 Getting IV Ferlecit 250mg  daily x4 doses. S/p 1 dose of Aranesp. Hgb down to 7.4. Will continue to monitor and replete to keep hgb >7.  Traumatic rib fractures w/ small PTX: receiving IV dilaudid for pain management, pulmonary toilet/IS and supplemental oxygen prn. Subcapsular liver hematoma: Hgb trending down. Still endorsing pain on RUQ. Monitor closely with daily CBC and abd exams. CAD w/Hx of PCI: Continued on atorvastatin. Holding home ASA for now.  Hx of BPH: Recent penile pump surgery one month prior; on home doxazosin. Does not appear to be retaining at this time. Continue bladder scans with I/O cath prn HTN: SBP still elevated in the 160s-170s. On home amlodipine. Anticipate some improvement with aggressive diuresis. Continue to monitor. Will likely need a 2nd agent for better BP control. Hyperlipidemia: on home atorvastatin Hx of gout: patient was on allopurinol 100mg  daily;  discontinued at this time. Can restart at 50mg  twice weekly next week.  Hypoerkalemia-   on scheduled lokelma and will increase iv lasix-  has not worsened   Lacinda Axon   Patient seen and examined, agree with above note with above modifications. Pt is more lucid today than he has been-  he tells [place] he is making plenty of urine and if he gets put on flomax.  I have told him my concern with renal function that it probably would be  best to go ahead and initiate dialysis for support.  He says that he is not ready for that, he feels fine and would prefer to wait and actually go home. I told him that going home would not be possible with his renal function worsening daily.  Access might be an issue if we do not get it in today.  However, he does not look overly uremic-  is making urine so I think we will be able to manage medically over the weekend-  give this news some time to sink in  Corliss Parish, MD 11/28/2020     Labs: Basic Metabolic Panel: Recent Labs  Lab 11/26/20 0409 11/27/20 0139  11/27/20 0816 11/28/20 0202  NA 138 138  --  137  K 5.8* 5.9* 5.4* 5.6*  CL 110 111  --  108  CO2 21* 20*  --  20*  GLUCOSE 122* 113*  --  111*  BUN 37* 44*  --  50*  CREATININE 6.76* 7.93*  --  9.70*  CALCIUM 8.4* 8.4*  --  8.3*   Liver Function Tests: Recent Labs  Lab 11/24/20 1204  AST 48*  ALT 29  ALKPHOS 91  BILITOT 0.8  PROT 5.6*  ALBUMIN 2.3*   No results for input(s): LIPASE, AMYLASE in the last 168 hours. No results for input(s): AMMONIA in the last 168 hours. CBC: Recent Labs  Lab 11/24/20 1204 11/24/20 1215 11/25/20 0413 11/26/20 0409 11/27/20 0139 11/28/20 0202  WBC 5.7  --  8.2 8.0 9.2 8.4  NEUTROABS 3.2  --   --   --   --   --   HGB 9.2*   < > 8.6* 8.1* 7.7* 7.4*  HCT 30.3*   < > 27.4* 26.7* 25.2* 25.0*  MCV 86.8  --  84.0 85.3 85.4 86.5  PLT 166  --  168 152 146* 151   < > = values in this interval not displayed.   Cardiac Enzymes: Recent Labs  Lab 11/26/20 0409  CKTOTAL 1,056*   CBG: Recent Labs  Lab 11/27/20 1148 11/27/20 1220 11/27/20 1707 11/27/20 2115 11/28/20 0759  GLUCAP 153* 146* 93 129* 109*    Iron Studies:  Recent Labs    11/26/20 0409  IRON 22*  TIBC 210*  FERRITIN 120   Studies/Results: US RENAL  Result Date: 11/26/2020 CLINICAL DATA:  Acute kidney failure EXAM: RENAL / URINARY TRACT ULTRASOUND COMPLETE COMPARISON:  CT 11/24/2020 FINDINGS: Right Kidney: Renal measurements: 11.3 x 6 x 5.4 = volume: 191 mL. Echogenicity within normal limits. No mass or hydronephrosis visualized. Left Kidney: Renal measurements: 11.2 x 6 x 5.8 = volume: 204 mL. Echogenicity within normal limits. No mass or hydronephrosis visualized. Bladder: Appears normal for degree of bladder distention. Other: Small right pleural effusion IMPRESSION: 1. Unremarkable kidneys. No hydronephrosis. 2. Small right pleural effusion. Electronically Signed   By: Lucrezia Europe M.D.   On: 11/26/2020 15:33   DG Chest Port 1 View  Result Date: 11/28/2020 CLINICAL  DATA:  Pneumothorax EXAM: PORTABLE CHEST 1 VIEW COMPARISON:  11/25/2020, CT 11/24/2020 FINDINGS: Multiple right rib fractures best noted on prior CT examination are not well appreciated on the current exam. There is no pneumothorax. Minimal residual infiltrate at the right lung base. Mild right-sided volume loss. Left lung is clear. Trace subcutaneous gas within the right chest wall. Cardiac size within normal limits. Pulmonary vascularity is normal. IMPRESSION: Multiple acute right rib fractures not well appreciated on this examination. No pneumothorax. Improving right basilar  pulmonary infiltrate. Electronically Signed   By: Fidela Salisbury MD   On: 11/28/2020 03:42   Medications: Infusions:  ferric gluconate (FERRLECIT) IVPB 250 mg (11/27/20 0950)   promethazine (PHENERGAN) injection (IM or IVPB) 12.5 mg (11/26/20 1418)    Scheduled Medications:  acetaminophen  1,000 mg Oral Q6H   amLODipine  10 mg Oral Daily   bacitracin   Topical BID   bisacodyl  5 mg Oral Daily   cholecalciferol  2,000 Units Oral Daily   darbepoetin (ARANESP) injection - NON-DIALYSIS  150 mcg Subcutaneous Q Thu-1800   doxazosin  4 mg Oral QHS   heparin injection (subcutaneous)  5,000 Units Subcutaneous Q12H   insulin aspart  0-5 Units Subcutaneous QHS   insulin aspart  0-9 Units Subcutaneous TID WC   insulin aspart protamine - aspart  25 Units Subcutaneous Q breakfast   ketorolac  1 drop Both Eyes BID   lactose free nutrition  237 mL Oral TID WC   lidocaine  1 patch Transdermal Q24H   methocarbamol  1,000 mg Oral TID   pregabalin  50 mg Oral Daily   sertraline  200 mg Oral Daily   sodium zirconium cyclosilicate  10 g Oral BID    have reviewed scheduled and prn medications.  Physical Exam: General: Pleasant, well-appearing elderly man laying in bed. No acute distress. Head: Normocephalic. Atraumatic. CV: RRR. No murmurs, rubs, or gallops. 1-2+ BLE edema Pulmonary: On 2LNC. Lungs CTAB. Decreased lung sounds at the  bases. Distant bibasilar crackles.  Abdominal: Soft, nontender. Mildly distended. Normal BS. + umbilical hernia. Extremities: Palpable radial and DP pulses. Normal ROM. Skin: Warm and dry. No obvious rash or lesions. Neuro: A&Ox3. Moves all extremities. Normal sensation. No focal deficit. Psych: Normal mood and affect    11/28/2020,9:31 AM  LOS: 3 days
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
## 2020-11-28 NOTE — Progress Notes (Signed)
Bladder scan at 1212 showed 188 mL.

## 2020-11-29 ENCOUNTER — Inpatient Hospital Stay (HOSPITAL_COMMUNITY): Payer: No Typology Code available for payment source

## 2020-11-29 ENCOUNTER — Encounter (HOSPITAL_COMMUNITY): Payer: Self-pay

## 2020-11-29 HISTORY — PX: IR US GUIDE VASC ACCESS RIGHT: IMG2390

## 2020-11-29 HISTORY — PX: IR FLUORO GUIDE CV LINE RIGHT: IMG2283

## 2020-11-29 LAB — CBC
HCT: 24.4 % — ABNORMAL LOW (ref 39.0–52.0)
Hemoglobin: 7.6 g/dL — ABNORMAL LOW (ref 13.0–17.0)
MCH: 26.5 pg (ref 26.0–34.0)
MCHC: 31.1 g/dL (ref 30.0–36.0)
MCV: 85 fL (ref 80.0–100.0)
Platelets: 148 10*3/uL — ABNORMAL LOW (ref 150–400)
RBC: 2.87 MIL/uL — ABNORMAL LOW (ref 4.22–5.81)
RDW: 15 % (ref 11.5–15.5)
WBC: 9.7 10*3/uL (ref 4.0–10.5)
nRBC: 0.4 % — ABNORMAL HIGH (ref 0.0–0.2)

## 2020-11-29 LAB — BASIC METABOLIC PANEL
Anion gap: 9 (ref 5–15)
BUN: 55 mg/dL — ABNORMAL HIGH (ref 8–23)
CO2: 20 mmol/L — ABNORMAL LOW (ref 22–32)
Calcium: 8.3 mg/dL — ABNORMAL LOW (ref 8.9–10.3)
Chloride: 107 mmol/L (ref 98–111)
Creatinine, Ser: 10.24 mg/dL — ABNORMAL HIGH (ref 0.61–1.24)
GFR, Estimated: 5 mL/min — ABNORMAL LOW (ref >60)
Glucose, Bld: 102 mg/dL — ABNORMAL HIGH (ref 70–99)
Potassium: 5.2 mmol/L — ABNORMAL HIGH (ref 3.5–5.1)
Sodium: 136 mmol/L (ref 135–145)

## 2020-11-29 LAB — GLUCOSE, CAPILLARY
Glucose-Capillary: 109 mg/dL — ABNORMAL HIGH (ref 70–99)
Glucose-Capillary: 118 mg/dL — ABNORMAL HIGH (ref 70–99)
Glucose-Capillary: 117 mg/dL — ABNORMAL HIGH (ref 70–99)
Glucose-Capillary: 144 mg/dL — ABNORMAL HIGH (ref 70–99)

## 2020-11-29 MED ORDER — MIDAZOLAM HCL 2 MG/2ML IJ SOLN
INTRAMUSCULAR | Status: AC
  Filled 2020-11-29: qty 2

## 2020-11-29 MED ORDER — BOOST / RESOURCE BREEZE PO LIQD CUSTOM
1.0000 | Freq: Three times a day (TID) | ORAL | Status: DC
  Administered 2020-11-29: 1 via ORAL

## 2020-11-29 MED ORDER — LIDOCAINE HCL 1 % IJ SOLN
INTRAMUSCULAR | Status: AC | PRN
Start: 2020-11-29 — End: 2020-11-29
  Administered 2020-11-29: 5 mL

## 2020-11-29 MED ORDER — LIDOCAINE HCL (PF) 1 % IJ SOLN
INTRAMUSCULAR | Status: AC
  Filled 2020-11-29: qty 30

## 2020-11-29 MED ORDER — HEPARIN SODIUM (PORCINE) 1000 UNIT/ML IJ SOLN
INTRAMUSCULAR | Status: AC
  Filled 2020-11-29: qty 1

## 2020-11-29 MED ORDER — FENTANYL CITRATE (PF) 100 MCG/2ML IJ SOLN
INTRAMUSCULAR | Status: AC | PRN
  Administered 2020-11-29: 25 ug via INTRAVENOUS

## 2020-11-29 MED ORDER — CEFAZOLIN SODIUM-DEXTROSE 2-4 GM/100ML-% IV SOLN
INTRAVENOUS | Status: AC
  Filled 2020-11-29: qty 100

## 2020-11-29 MED ORDER — FENTANYL CITRATE (PF) 100 MCG/2ML IJ SOLN
INTRAMUSCULAR | Status: AC
  Filled 2020-11-29: qty 2

## 2020-11-29 MED ORDER — CHLORHEXIDINE GLUCONATE CLOTH 2 % EX PADS
6.0000 | MEDICATED_PAD | Freq: Every day | CUTANEOUS | Status: DC
  Administered 2020-11-30 – 2020-12-01 (×2): 6 via TOPICAL

## 2020-11-29 MED ORDER — CEFAZOLIN SODIUM-DEXTROSE 2-4 GM/100ML-% IV SOLN
INTRAVENOUS | Status: AC | PRN
  Administered 2020-11-29: 2 g via INTRAVENOUS

## 2020-11-29 MED ORDER — MIDAZOLAM HCL 2 MG/2ML IJ SOLN
INTRAMUSCULAR | Status: AC | PRN
  Administered 2020-11-29: 1 mg via INTRAVENOUS

## 2020-11-29 NOTE — Procedures (Signed)
Interventional Radiology Procedure Note  Procedure: Tunneled HD catheter placement  Complications: None  Estimated Blood Loss: < 10 mL  Findings: 23 cm tip to cuff length Palindrome catheter placed via right IJ with tip in RA. OK to use.  Venetia Night. Kathlene Cote, M.D Pager:  [phone]
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
## 2020-11-29 NOTE — Progress Notes (Signed)
Called wife back. An MD had called and left her a message late yesterday, and she can't find the number the call them back.

## 2020-11-29 NOTE — Progress Notes (Signed)
IR called, they will be sending transport for pt.  Pt is nervous.  Called Pt's wife to let her know. She and her daughter will be on their way.

## 2020-11-29 NOTE — Progress Notes (Signed)
Off unit to IR for HD cath placement

## 2020-11-29 NOTE — Progress Notes (Signed)
Tunneled HD in place. Report received from IR.

## 2020-11-29 NOTE — Progress Notes (Signed)
Called and left message on wife's phone for her to call pt. He was asking to talk with her.

## 2020-11-30 LAB — GLUCOSE, CAPILLARY
Glucose-Capillary: 130 mg/dL — ABNORMAL HIGH (ref 70–99)
Glucose-Capillary: 141 mg/dL — ABNORMAL HIGH (ref 70–99)
Glucose-Capillary: 101 mg/dL — ABNORMAL HIGH (ref 70–99)
Glucose-Capillary: 112 mg/dL — ABNORMAL HIGH (ref 70–99)

## 2020-11-30 LAB — BASIC METABOLIC PANEL
Anion gap: 14 (ref 5–15)
BUN: 58 mg/dL — ABNORMAL HIGH (ref 8–23)
CO2: 19 mmol/L — ABNORMAL LOW (ref 22–32)
Calcium: 8.3 mg/dL — ABNORMAL LOW (ref 8.9–10.3)
Chloride: 105 mmol/L (ref 98–111)
Creatinine, Ser: 9.84 mg/dL — ABNORMAL HIGH (ref 0.61–1.24)
GFR, Estimated: 5 mL/min — ABNORMAL LOW (ref >60)
Glucose, Bld: 102 mg/dL — ABNORMAL HIGH (ref 70–99)
Potassium: 4.5 mmol/L (ref 3.5–5.1)
Sodium: 138 mmol/L (ref 135–145)

## 2020-11-30 LAB — CBC
HCT: 23.9 % — ABNORMAL LOW (ref 39.0–52.0)
Hemoglobin: 7.5 g/dL — ABNORMAL LOW (ref 13.0–17.0)
MCH: 26.2 pg (ref 26.0–34.0)
MCHC: 31.4 g/dL (ref 30.0–36.0)
MCV: 83.6 fL (ref 80.0–100.0)
Platelets: 163 10*3/uL (ref 150–400)
RBC: 2.86 MIL/uL — ABNORMAL LOW (ref 4.22–5.81)
RDW: 15 % (ref 11.5–15.5)
WBC: 9.9 10*3/uL (ref 4.0–10.5)
nRBC: 1.2 % — ABNORMAL HIGH (ref 0.0–0.2)

## 2020-11-30 MED ORDER — HEPARIN SODIUM (PORCINE) 5000 UNIT/ML IJ SOLN
5000.0000 [IU] | Freq: Three times a day (TID) | INTRAMUSCULAR | Status: DC
  Administered 2020-11-30 – 2020-12-01 (×3): 5000 [IU] via SUBCUTANEOUS
  Filled 2020-11-30 (×2): qty 1

## 2020-11-30 MED ORDER — SERTRALINE HCL 100 MG PO TABS
200.0000 mg | ORAL_TABLET | Freq: Every day | ORAL | Status: DC
  Administered 2020-11-30: 200 mg via ORAL
  Filled 2020-11-30: qty 2

## 2020-11-30 MED ORDER — SODIUM ZIRCONIUM CYCLOSILICATE 10 G PO PACK
10.0000 g | PACK | Freq: Every day | ORAL | Status: DC
  Administered 2020-11-30 – 2020-12-01 (×2): 10 g via ORAL
  Filled 2020-11-30 (×2): qty 1
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
## 2020-11-30 NOTE — Progress Notes (Addendum)
Pt stated he takes 30 units insulin aspart protamine daily at home and he doesn't understand why he is only getting 25 units daily here. He does not do sliding scale at home.  He stated he takes Zoloft at bedtime at home. He did not want it during the day yesterday because he said it makes him too sleepy.

## 2020-12-01 LAB — RENAL FUNCTION PANEL
Albumin: 2.7 g/dL — ABNORMAL LOW (ref 3.5–5.0)
Anion gap: 10 (ref 5–15)
BUN: 55 mg/dL — ABNORMAL HIGH (ref 8–23)
CO2: 21 mmol/L — ABNORMAL LOW (ref 22–32)
Calcium: 8.4 mg/dL — ABNORMAL LOW (ref 8.9–10.3)
Chloride: 107 mmol/L (ref 98–111)
Creatinine, Ser: 9.12 mg/dL — ABNORMAL HIGH (ref 0.61–1.24)
GFR, Estimated: 6 mL/min — ABNORMAL LOW (ref >60)
Glucose, Bld: 110 mg/dL — ABNORMAL HIGH (ref 70–99)
Phosphorus: 5.1 mg/dL — ABNORMAL HIGH (ref 2.5–4.6)
Potassium: 4 mmol/L (ref 3.5–5.1)
Sodium: 138 mmol/L (ref 135–145)

## 2020-12-01 LAB — GLUCOSE, CAPILLARY
Glucose-Capillary: 141 mg/dL — ABNORMAL HIGH (ref 70–99)
Glucose-Capillary: 121 mg/dL — ABNORMAL HIGH (ref 70–99)

## 2020-12-01 MED ORDER — PREGABALIN 25 MG PO CAPS: 50.0000 mg | ORAL_CAPSULE | Freq: Every day | ORAL | Status: AC

## 2020-12-01 MED ORDER — FUROSEMIDE 80 MG PO TABS: 80.0000 mg | ORAL_TABLET | Freq: Two times a day (BID) | ORAL | 2 refills | Status: AC

## 2020-12-01 MED ORDER — PREGABALIN 25 MG PO CAPS: 25.0000 mg | ORAL_CAPSULE | Freq: Two times a day (BID) | ORAL | Status: DC

## 2020-12-01 MED ORDER — SIMETHICONE 80 MG PO CHEW
80.0000 mg | CHEWABLE_TABLET | Freq: Once | ORAL | Status: AC
  Administered 2020-12-01: 80 mg via ORAL
  Filled 2020-12-01: qty 1

## 2020-12-01 MED ORDER — ACETAMINOPHEN 500 MG PO TABS: 1000.0000 mg | ORAL_TABLET | Freq: Four times a day (QID) | ORAL | 0 refills | Status: AC

## 2020-12-01 MED ORDER — AMLODIPINE BESYLATE 10 MG PO TABS: 10.0000 mg | ORAL_TABLET | Freq: Every day | ORAL | Status: AC

## 2020-12-01 MED ORDER — ALLOPURINOL 100 MG PO TABS: 100.0000 mg | ORAL_TABLET | Freq: Every day | ORAL | Status: AC

## 2020-12-01 NOTE — Progress Notes (Signed)
PT Cancellation Note  Patient Details Name: Maurice Lopez MRN: [id number] DOB: 1950-08-10   Cancelled Treatment:    Reason Eval/Treat Not Completed: Other (comment).  Reports he is leaving today and declines to have PT.  Discussed the plan with stairs and he states he does not need to try them.  Encouraged him to let nursing know if he reconsiders.   Ramond Dial 12/01/2020, 12:09 PM  Mee Hives, PT MS Acute Rehab Dept. Number: [place]

## 2020-12-01 NOTE — Progress Notes (Signed)
Patient discharged to home with instructions. 

## 2020-12-01 NOTE — TOC Transition Note (Signed)
Transition of Care [facility]) - CM/SW Discharge Note   Patient Details  Name: Maurice Lopez MRN: [id number] Date of Birth: 1950-06-06  Transition of Care [facility]) CM/SW Contact:  Ella Bodo, RN Phone Number: 12/01/2020, 3:37 PM   Clinical Narrative:   Patient medically stable for discharge home today with spouse to provide assistance with care.  PT recommending home health follow-up, but patient politely declines need for therapy services.  Patient states he has all needed DME at home.  Patient weaned from oxygen and is currently on room air.  He has a PCP follow-up appointment with the [facility] hospital in [place] for tomorrow, July 26.      Barriers to Discharge: Barriers Resolved   Patient Goals and CMS Choice Patient states their goals for this hospitalization and ongoing recovery are:: to go home                            Discharge Plan and Services   Discharge Planning Services: CM Consult                      HH Arranged: Patient Refused [facility]          Social Determinants of Health (SDOH) Interventions     Readmission Risk Interventions Readmission Risk Prevention Plan 12/01/2020  Transportation Screening Complete  PCP or Specialist Appt within 3-5 Days Complete  HRI or [place] Patient refused  Social Work Consult for [place] Planning/Counseling Patient refused  Palliative Care Screening Not Applicable  Medication Review (RN Care Manager) Complete   Reinaldo Raddle, RN, BSN  Trauma/Neuro ICU Case Manager [phone]
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
## 2020-12-01 NOTE — Discharge Instructions (Signed)
You will need to follow up with nephrology at the [facility] ASAP. Call to make this appointment ASAP
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
## 2021-01-06 ENCOUNTER — Other Ambulatory Visit (HOSPITAL_COMMUNITY): Payer: Self-pay | Admitting: Physician Assistant

## 2021-01-06 DIAGNOSIS — N185 Chronic kidney disease, stage 5: Secondary | ICD-10-CM
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
## 2022-10-27 IMAGING — DX DG CHEST 1V PORT
1 series · 1 of 1 positions shown · non-contrast
Comparison: CT 11/24/2020.  Chest x-ray 11/24/2020.

CLINICAL DATA: Right pneumothorax.

EXAM:
PORTABLE CHEST 1 VIEW

[chest]
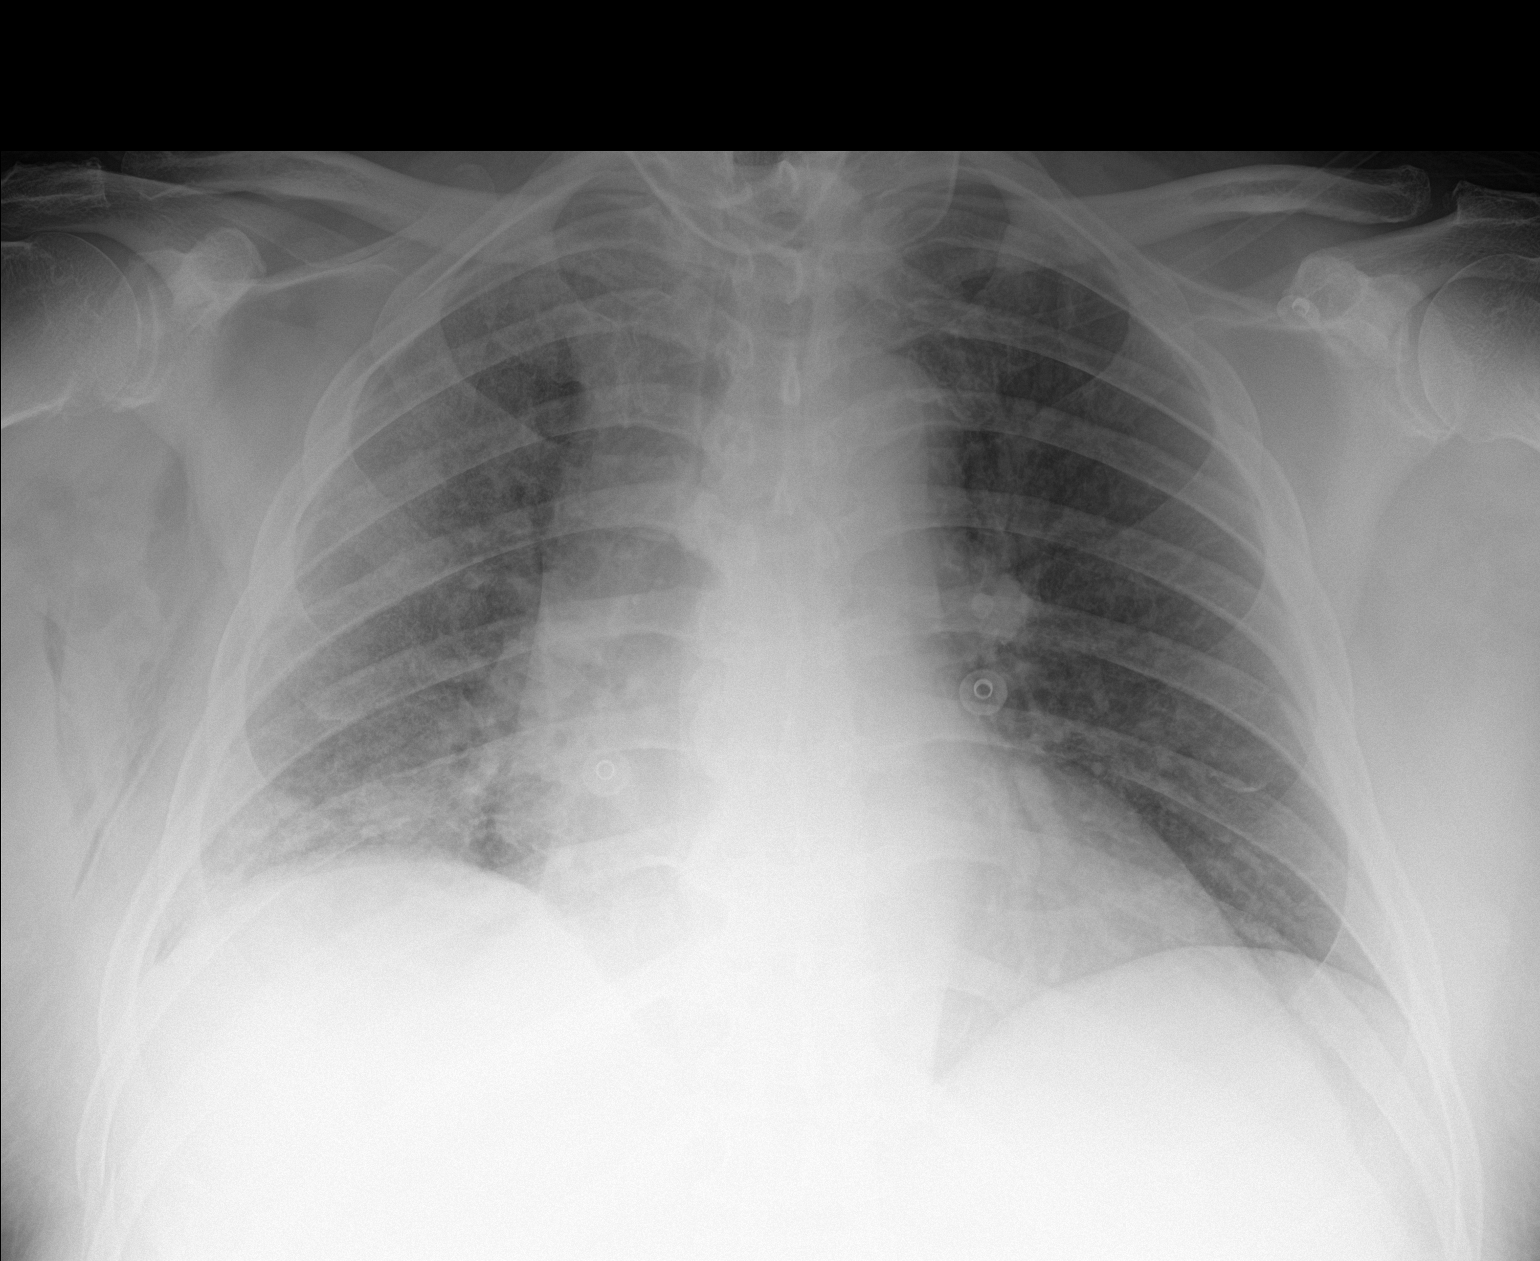

[1 of 1 positions shown; findings below may reference images not displayed]

FINDINGS: Stable mild mediastinal prominence consistent prominent mediastinal
fat as noted on prior CT. Heart size stable. Diffuse right lung
infiltrate, improved from prior exam. No pleural effusion. Tiny
right pneumothorax best identified by prior CT. Mild right chest
wall subcutaneous emphysema. Multiple right rib fractures best
identified by prior CT.
IMPRESSION: 1. Multiple right rib fractures best identified by prior CT. Tiny
right pneumothorax best identified by prior CT. Mild right chest
wall subcutaneous emphysema noted.

2.  Diffuse right lung infiltrate, improved from prior exam.

## 2022-10-28 IMAGING — US US RENAL
1 series · 14 of 25 positions shown · non-contrast
Comparison: CT 11/24/2020

CLINICAL DATA: Acute kidney failure

EXAM:
RENAL / URINARY TRACT ULTRASOUND COMPLETE

[Series 1: us renal · 14 of 37 slices shown]
[im 1/37]
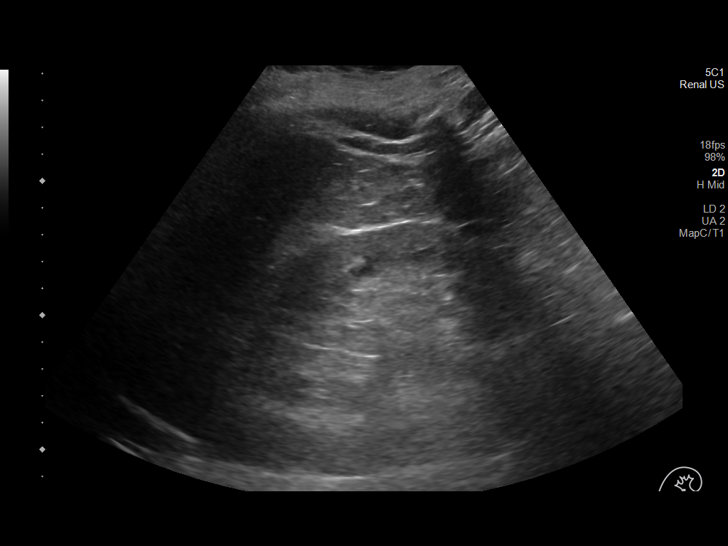
[im 4/37]
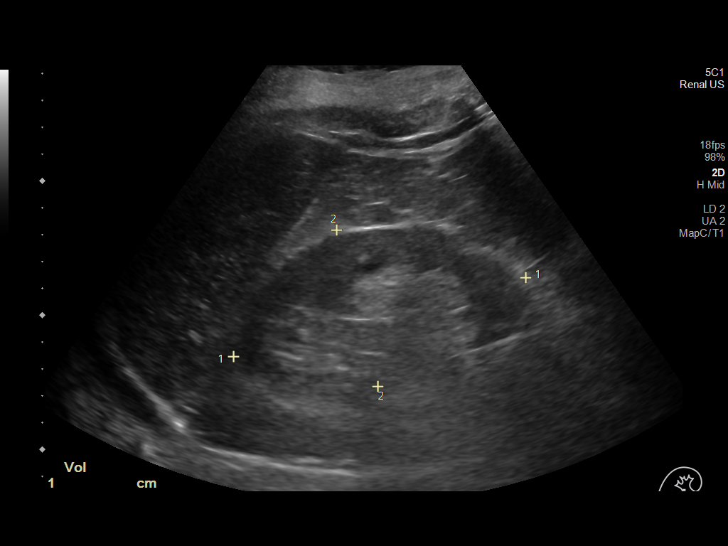
[im 7/37]
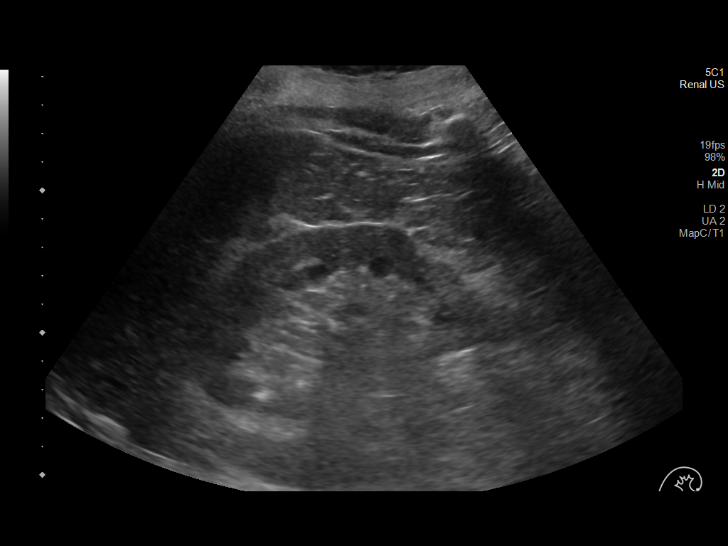
[im 10/37]
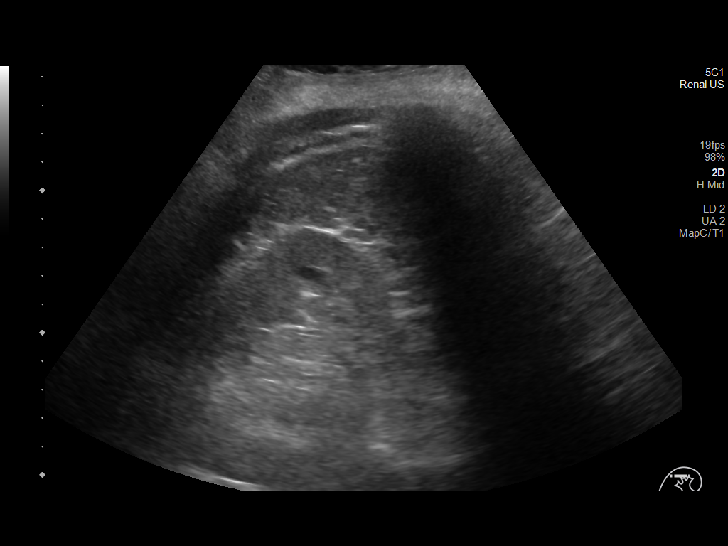
[im 13/37]
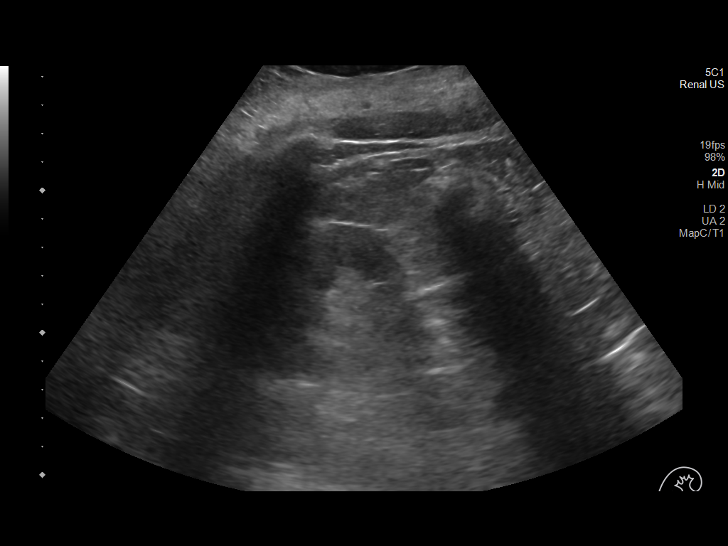
[im 14/37]
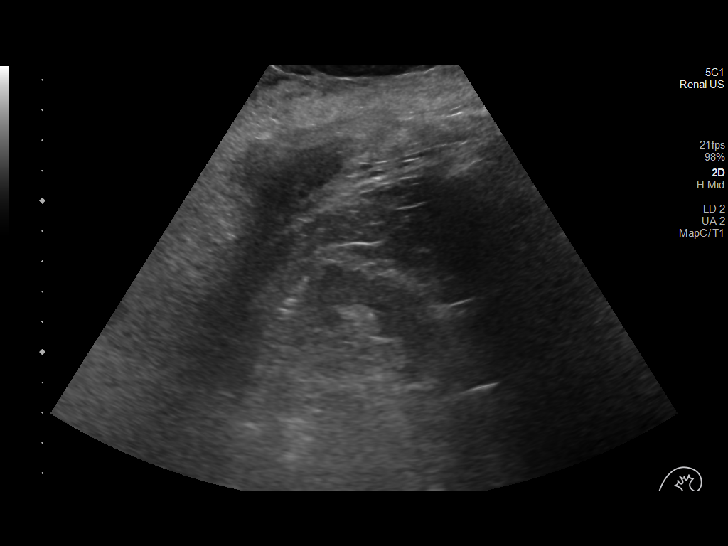
[im 17/37]
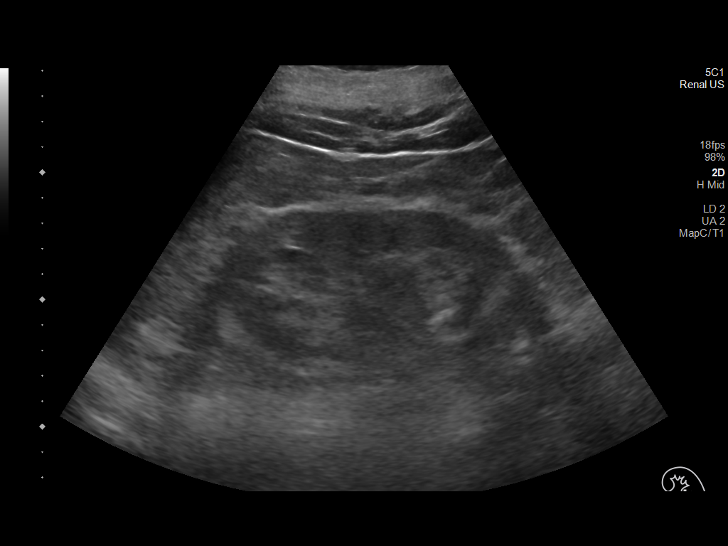
[im 20/37]
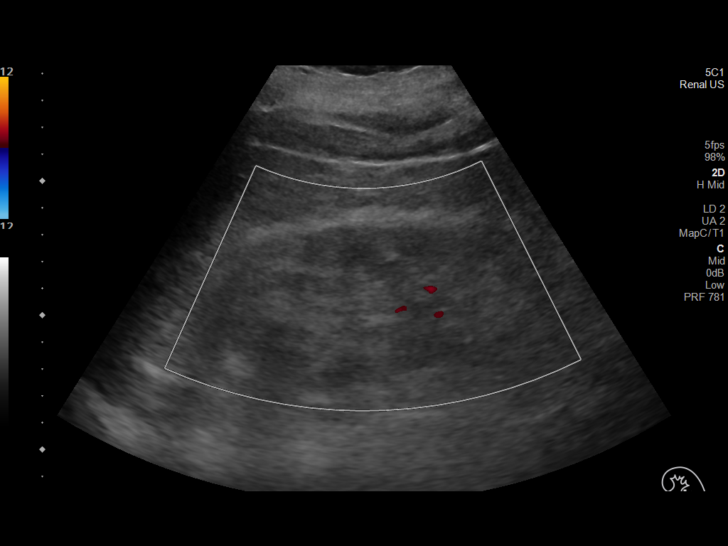
[im 23/37]
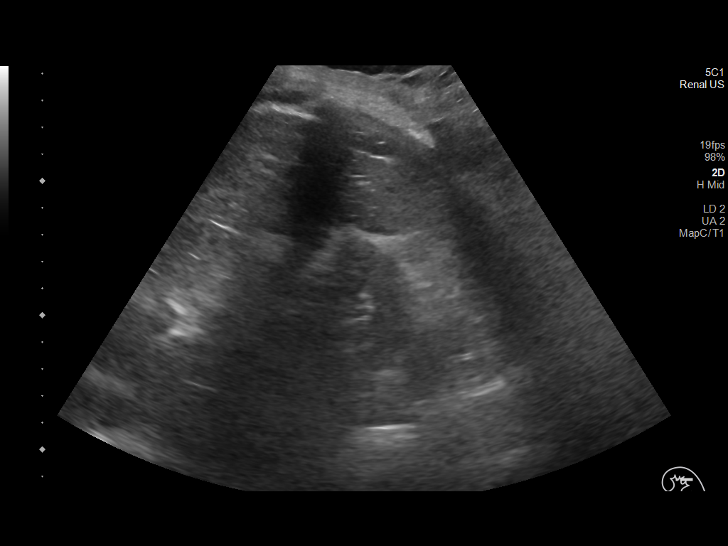
[im 25/37]
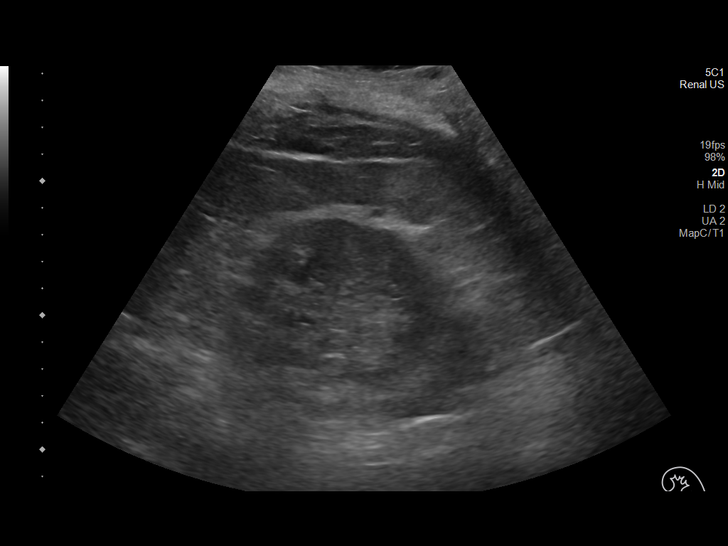
[im 28/37]
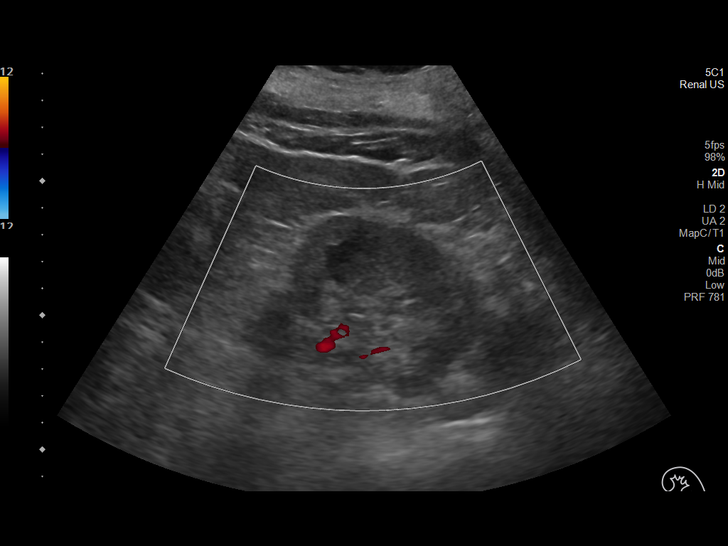
[im 31/37]
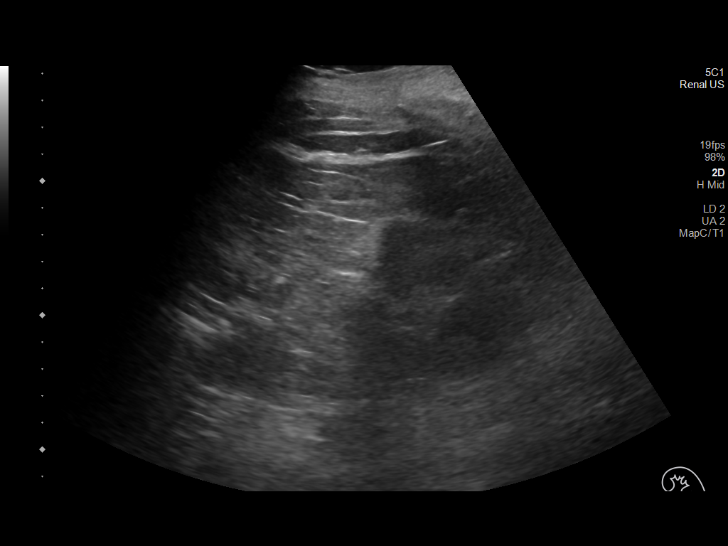
[im 34/37]
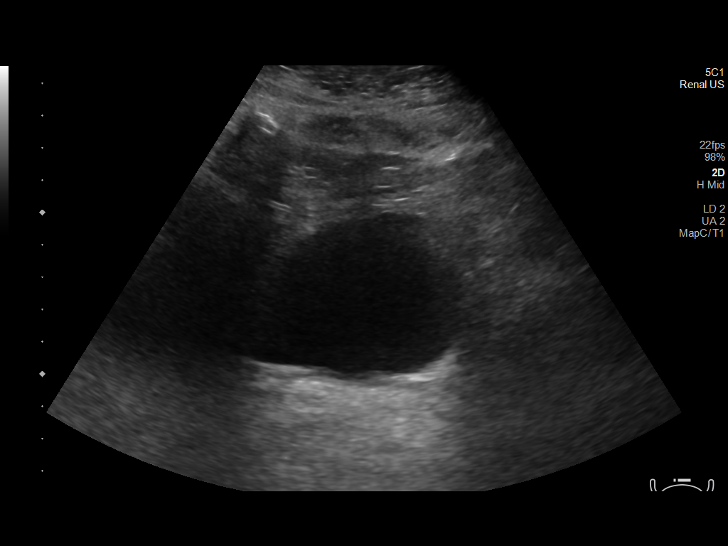
[im 37/37]
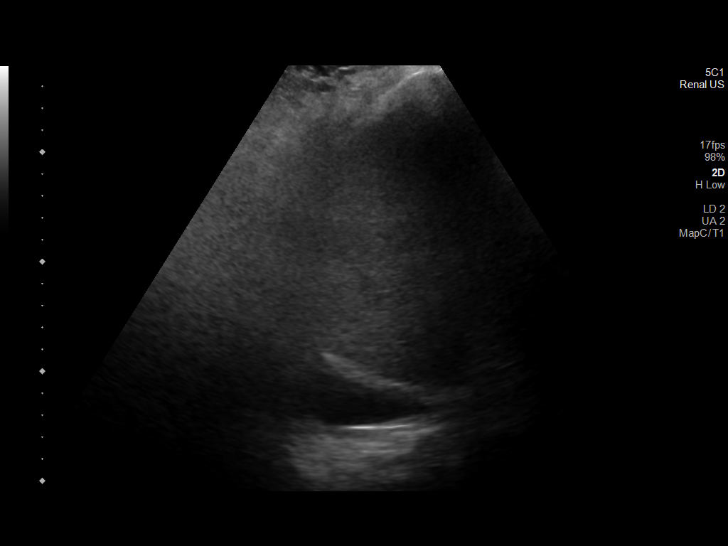

[14 of 25 positions shown; findings below may reference images not displayed]

FINDINGS: Right Kidney:

Renal measurements: 11.3 x 6 x 5.4 = volume: 191 mL. Echogenicity
within normal limits. No mass or hydronephrosis visualized.

Left Kidney:

Renal measurements: 11.2 x 6 x 5.8 = volume: 204 mL. Echogenicity
within normal limits. No mass or hydronephrosis visualized.

Bladder:

Appears normal for degree of bladder distention.

Other:

Small right pleural effusion
IMPRESSION: 1. Unremarkable kidneys. No hydronephrosis.
2. Small right pleural effusion.
